# Patient Record
Sex: Male | Born: 2000 | Hispanic: Yes | Marital: Single
Health system: Southern US, Community
[De-identification: ages and names within clinical notes are randomized; demographics above are authoritative.]

---
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
## 2023-05-08 ENCOUNTER — Emergency Department

## 2023-05-08 ENCOUNTER — Other Ambulatory Visit: Payer: Self-pay

## 2023-05-08 ENCOUNTER — Emergency Department
Admission: EM | Admit: 2023-05-08 | Discharge: 2023-05-08 | Disposition: A | Discharge status: Home / Self Care | Attending: Emergency Medicine | Admitting: Emergency Medicine

## 2023-05-08 DIAGNOSIS — M79674 Pain in right toe(s): Secondary | ICD-10-CM | POA: Insufficient documentation

## 2023-05-08 MED ORDER — KETOROLAC TROMETHAMINE 15 MG/ML IJ SOLN
30.0000 mg | Freq: Once | INTRAMUSCULAR | Status: DC
  Filled 2023-05-08: qty 2

## 2023-05-08 MED ORDER — KETOROLAC TROMETHAMINE 60 MG/2ML IM SOLN
30.0000 mg | Freq: Once | INTRAMUSCULAR | Status: AC
  Administered 2023-05-08: 30 mg via INTRAMUSCULAR

## 2023-05-08 NOTE — ED Provider Notes (Signed)
 [place] [facility] AT [facility] Provider Note   CSN: [id number] Arrival date & time: 05/08/23  1802     History  Chief Complaint  Patient presents with   Toe Pain    Right great toe    Isaac Roman is a 23 y.o. male.  Patient here for toe pain.  Right great toe pain started this morning.  No injury but he does have a history of injury to this toe with resulting fracture.  He is also he does note that she is stable for transfer at this time.  Pain has been quite severe at times today.  It has caused him to see spots at times.  Consult [facility] for denies any fevers or chills.  Discussion   Toe Pain Pertinent negatives include no chest pain, no abdominal pain and no shortness of breath.       Home Medications Prior to Admission medications   Not on File      Allergies    Patient has no known allergies.    Review of Systems   Review of Systems  Constitutional:  Negative for chills and fever.  HENT:  Negative for congestion.   Respiratory:  Negative for cough and shortness of breath.   Cardiovascular:  Negative for chest pain.  Gastrointestinal:  Negative for abdominal pain, diarrhea, nausea and vomiting.  Genitourinary:  Negative for dysuria.  Musculoskeletal:  Positive for arthralgias.  Neurological:  Negative for weakness and numbness.    Physical Exam Updated Vital Signs BP 125/79   Pulse 100   Temp 98 F (36.7 C) (Oral)   Resp 16   Ht 5\' 8"  (1.727 m)   Wt 95.3 kg   SpO2 98%   BMI 31.93 kg/m  Physical Exam Vitals reviewed.  Constitutional:      Appearance: Normal appearance.  HENT:     Head: Normocephalic and atraumatic.     Nose: Nose normal.  Cardiovascular:     Pulses: Normal pulses.  Pulmonary:     Effort: Pulmonary effort is normal.  Musculoskeletal:     Cervical back: Normal range of motion.     Comments: Right great toe with exquisite tenderness especially overlying the distal interphalangeal joint.   Good range of motion.  Capillary refill less than 2 seconds.  No redness or swelling.  No evident rash.  There  Neurological:     Mental Status: He is alert and oriented to person, place, and time. Mental status is at baseline.  Psychiatric:        Mood and Affect: Mood normal.        Behavior: Behavior normal.     ED Results / Procedures / Treatments   Labs (all labs ordered are listed, but only abnormal results are displayed) Labs Reviewed - No data to display  EKG None  Radiology DG Toe Great Right Result Date: 05/08/2023 CLINICAL DATA:  Pain EXAM: RIGHT GREAT TOE COMPARISON:  09/12/2014 FINDINGS: Old bone fragment noted adjacent to the distal aspect of the right great toe proximal phalanx related to old injury. No acute fracture, subluxation or dislocation. Soft tissues are intact. Joint spaces maintained. IMPRESSION: No acute bony abnormality. Electronically Signed   By: Janeece Mechanic M.D.   On: 05/08/2023 18:34    Procedures Procedures    Medications Ordered in ED Medications  ketorolac (TORADOL) 15 MG/ML injection 30 mg (has no administration in time range)    ED Course/ Medical Decision Making/ A&P  Medical Decision Making Patient here for right great toe pain with a history of old injury.  Pain is worse with movement.  Neurovasculature is intact.  He is afebrile nontachycardic not reporting any fevers no redness or swelling.  Do not suspect a septic joint no evidence of cellulitis.  He does have a bone fragment on my independent interpretation x-ray reads this as old.  I will recommend NSAIDs, RICE.  Follow-up with orthopedics.  Given return precautions here.  Amount and/or Complexity of Data Reviewed Radiology: ordered.  Risk Prescription drug management.           Final Clinical Impression(s) / ED Diagnoses Final diagnoses:  Great toe pain, right    Rx / DC Orders ED Discharge Orders     None         Hollie Luria, [place] 05/08/23 1843    Lind Repine, MD 05/08/23 [phone]

## 2023-05-08 NOTE — Discharge Instructions (Signed)
 Recommend rest ice and elevation.  Take Tylenol 650 mg 4 times daily.  You may also take ibuprofen 600 mg 3 times daily with food.  Follow-up with orthopedics for further evaluation.  Return here for new or worse symptoms including numbness and weakness.

## 2023-05-08 NOTE — ED Notes (Signed)
 Pt discharge information reviewed. Pt understands importance for need of follow up care, and when to return if symptoms worsen.  Pt understands all information. All questions answered. Pt seen ambulating out of department with strong steady gait with family.

## 2023-05-08 NOTE — ED Triage Notes (Addendum)
 Pt to ed from home via POV for toe pain that started this morning. Pt states "it is painful to move it". But denies any swelling. Pt is caox4, in no acute distress in triage. Pt advised he has broken this same toe about 5 years ago and it just healed naturally. Toe appears to be normal.
